# Patient Record
Sex: Male | Born: 2005 | Race: White | Hispanic: No | Marital: Single | Smoking: Never smoker
Health system: Southern US, Community
[De-identification: ages and names within clinical notes are randomized; demographics above are authoritative.]

## PROBLEM LIST (undated history)

## (undated) DIAGNOSIS — J45909 Unspecified asthma, uncomplicated: Secondary | ICD-10-CM

## (undated) DIAGNOSIS — F902 Attention-deficit hyperactivity disorder, combined type: Secondary | ICD-10-CM

## (undated) DIAGNOSIS — R278 Other lack of coordination: Secondary | ICD-10-CM

## (undated) HISTORY — DX: Other lack of coordination: R27.8

## (undated) HISTORY — DX: Attention-deficit hyperactivity disorder, combined type: F90.2

## (undated) HISTORY — PX: TYMPANOSTOMY TUBE PLACEMENT: SHX32

## (undated) HISTORY — PX: TONSILLECTOMY: SUR1361

---
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
## 2023-01-03 NOTE — Telephone Encounter (Signed)
[facility] has tried to reach out to patient.  I was able to reach patient today.  I have given him [facility] number to call by tomorrow 11/8 to get scheduled.  I have also informed [facility].  I have also provided patient with the name and number of a Karlene Einstein at [phone] for private pay counseling.

## 2023-01-03 NOTE — Telephone Encounter (Signed)
LVM to patient with [facility] information and Karlene Einstein phone number [phone]

## 2023-01-18 ENCOUNTER — Ambulatory Visit (INDEPENDENT_AMBULATORY_CARE_PROVIDER_SITE_OTHER): Payer: 59 | Admitting: Family Medicine

## 2023-01-18 ENCOUNTER — Encounter: Payer: Self-pay | Admitting: Family Medicine

## 2023-01-18 VITALS — BP 119/73 | HR 93 | Temp 97.5°F | Ht 69.0 in | Wt 271.6 lb

## 2023-01-18 DIAGNOSIS — F902 Attention-deficit hyperactivity disorder, combined type: Secondary | ICD-10-CM

## 2023-01-18 DIAGNOSIS — F419 Anxiety disorder, unspecified: Secondary | ICD-10-CM | POA: Diagnosis not present

## 2023-01-18 NOTE — Assessment & Plan Note (Signed)
Not currently on any medications.  He would like to avoid stimulants.  He has had improvement in his ADHD symptoms with starting Wellbutrin.  He feels like he is at a good dose now with the 150 mg daily.  They will follow-up next year for the physical family will reach out to [place] sooner if any issues arise.

## 2023-01-18 NOTE — Patient Instructions (Addendum)
It was very nice to see you today!  I am glad that you are feeling better!  We will continue your current medications.  Please keep your appoint with a therapist.  Will see back next year for your physical.  Please come back sooner if needed.  Return for Annual Physical.   Take care, Dr Jimmey Ralph  PLEASE NOTE:  If you had any lab tests, please let us know if you have not heard back within a few days. You may see your results on mychart before we have a chance to review them but we will give you a call once they are reviewed by [place].   If we ordered any referrals today, please let us know if you have not heard from their office within the next week.   If you had any urgent prescriptions sent in today, please check with the pharmacy within an hour of our visit to make sure the prescription was transmitted appropriately.   Please try these tips to maintain a healthy lifestyle:  Eat at least 3 REAL meals and 1-2 snacks per day.  Aim for no more than 5 hours between eating.  If you eat breakfast, please do so within one hour of getting up.   Each meal should contain half fruits/vegetables, one quarter protein, and one quarter carbs (no bigger than a computer mouse)  Cut down on sweet beverages. This includes juice, soda, and sweet tea.   Drink at least 1 glass of water with each meal and aim for at least 8 glasses per day  Exercise at least 150 minutes every week.

## 2023-01-18 NOTE — Progress Notes (Signed)
   Shateek Tillar is a 17 y.o. male who presents today for an office visit.  Assessment/Plan:  Chronic Problems Addressed Today: Anxiety Symptoms are much better controlled today on current regimen of Cymbalta 30 mg daily and Wellbutrin 150 mg daily.  He is tolerating well with minimal side effects.  We will continue his current regimen.  He will see his therapist for initial visit in a few weeks.  Anticipate he will have continued improvement over the next several weeks.  He will follow-up with me next year for her physical however did stress importance of them to reach out to [place] sooner if he notices any negative change with his mood.  ADHD (attention deficit hyperactivity disorder), combined type Not currently on any medications.  He would like to avoid stimulants.  He has had improvement in his ADHD symptoms with starting Wellbutrin.  He feels like he is at a good dose now with the 150 mg daily.  They will follow-up next year for the physical family will reach out to [place] sooner if any issues arise.    Subjective:  HPI:  See assessment / plan for status of chronic conditions. Patient here today for follow up. We last saw him 3 weeks ago for his anxiety and ADHD.  At that time we continued him on his Cymbalta as well as started him on Wellbutrin.  He has done well with this for the last several weeks.  Did have some mild insomnia but this is manageable.  His mood is much better controlled.  He has also noticed improvement in his motivation.  He has done well with staying away from electronic devices.  He recently took a second job with [employer] and this is going well.  Overall feels like he is on a good combination of medications and would like to continue with his current regimen.  He will be seeing a therapist in a few weeks.       Objective:  Physical Exam: BP 119/73   Pulse 93   Temp (!) 97.5 F (36.4 C) (Temporal)   Ht 5\' 9"  (1.753 m)   Wt 271 lb 9.6 oz (123.2 kg)   SpO2 97%   BMI 40.11  kg/m   Wt Readings from Last 3 Encounters:  01/18/23 271 lb 9.6 oz (123.2 kg) (>99%, Z= 2.72)*  12/28/22 269 lb 3.2 oz (122.1 kg) (>99%, Z= 2.69)*  08/20/22 240 lb (108.9 kg) (99%, Z= 2.26)*   * Growth percentiles are based on CDC (Boys, 2-20 Years) data.    Gen: No acute distress, resting comfortably CV: Regular rate and rhythm with no murmurs appreciated Pulm: Normal work of breathing, clear to auscultation bilaterally with no crackles, wheezes, or rhonchi Neuro: Grossly normal, moves all extremities Psych: Normal affect and thought content      Ikeisha Blumberg M. Jimmey Ralph, MD 01/18/2023 11:32 AM

## 2023-01-18 NOTE — Assessment & Plan Note (Signed)
Symptoms are much better controlled today on current regimen of Cymbalta 30 mg daily and Wellbutrin 150 mg daily.  He is tolerating well with minimal side effects.  We will continue his current regimen.  He will see his therapist for initial visit in a few weeks.  Anticipate he will have continued improvement over the next several weeks.  He will follow-up with me next year for her physical however did stress importance of them to reach out to [place] sooner if he notices any negative change with his mood.

## 2023-01-27 DIAGNOSIS — Z419 Encounter for procedure for purposes other than remedying health state, unspecified: Secondary | ICD-10-CM | POA: Diagnosis not present

## 2023-02-08 ENCOUNTER — Ambulatory Visit: Payer: 59 | Admitting: Psychology

## 2023-02-08 DIAGNOSIS — F419 Anxiety disorder, unspecified: Secondary | ICD-10-CM

## 2023-02-08 DIAGNOSIS — F902 Attention-deficit hyperactivity disorder, combined type: Secondary | ICD-10-CM

## 2023-02-08 NOTE — Progress Notes (Signed)
Comprehensive Clinical Assessment (CCA) Note  02/08/2023 Garrett Armstrong [id number]  Time Spent: 10:06  am - 10:50 am: 44 Minutes  Chief Complaint: No chief complaint on file.  Visit Diagnosis: Anxiety and ADHD.    Guardian/Payee:  self    Paperwork requested: No   Reason for Visit /Presenting Problem:  Anxiety   Mental Status Exam: Appearance:   Casual     Behavior:  Appropriate  Motor:  Normal  Speech/Language:   Clear and Coherent  Affect:  Appropriate  Mood:  normal  Thought process:  normal  Thought content:    WNL  Sensory/Perceptual disturbances:    WNL  Orientation:  oriented to person, place, time/date, and situation  Attention:  Good  Concentration:  Good  Memory:  WNL  Fund of knowledge:   Good  Insight:    Good  Judgment:   Good  Impulse Control:  Good   Reported Symptoms:  anxiety, depression, and ADHD  Risk Assessment: Danger to Self:  No Self-injurious Behavior: No Danger to Others: No Duty to Warn:no Physical Aggression / Violence:No  Access to Firearms a concern: No  Gang Involvement:No  Patient / guardian was educated about steps to take if suicide or homicide risk level increases between visits: no While future psychiatric events cannot be accurately predicted, the patient does not currently require acute inpatient psychiatric care and does not currently meet [facility] involuntary commitment criteria.   Family history of cousins attempting suicide, "quite a few".   In case of a mental health emergency:  58 - confidential suicide hotline. [facility]        [address], [place] [address]       [phone] 3.   911  4.   Visiting Nearest ED.    Substance Abuse History: Current substance abuse: No     Caffeine: ~3x-4x a week.  Tobacco: NA Alcohol: denied.  Substance use: Denied.   Past Psychiatric History:   Previous psychological history is significant for ADHD and  anxiety Outpatient Providers: Previously seeing Dr. Christy Gentles History of Psych Hospitalization: No  Psychological Testing:  Dr. Reita Cliche Crumb.     [place] is unaware of any familial mental health concerns.    Abuse History:  Victim of: No.,  na    Report needed: No. Victim of Neglect:No. Perpetrator of  na   Witness / Exposure to Domestic Violence: No   Protective Services Involvement: No  Witness to [employer] Violence:  No   Family History:  Family History  Adopted: Yes  Problem Relation Age of Onset   Diabetes Mother    Drug abuse Mother    Drug abuse Father    Asthma Father    Diabetes Maternal Aunt    Diabetes Maternal Uncle    Diabetes Other     Living situation: the patient lives with their family  Sexual Orientation: Straight  Relationship Status: single  Name of spouse / other: na If a parent, number of children / ages:na  Support Systems: parents  Financial Stress:   NA  Income/Employment/Disability: Employment: part-time jobs at [employer] and [employer] .  Military Service: No   Educational History: Education: Government social research officer, taking a semester off and noted failing a semester. 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   Stressors: Other: worry about  being a failure, mood and symptoms.     Strengths: Family, Friends, Hopefulness, Journalist, newspaper, and Able to Communicate Effectively  Barriers:  Mood   Legal History: Pending legal issue / charges: The patient has no significant history of legal issues. History of legal issue / charges:  None  Medical History/Surgical History: reviewed Past Medical History:  Diagnosis Date   ADHD (attention deficit hyperactivity disorder), combined type 06/02/2015   Asthma    Dysgraphia 06/02/2015    Past Surgical History:   Procedure Laterality Date   TONSILLECTOMY     TYMPANOSTOMY TUBE PLACEMENT      Medications: Current Outpatient Medications  Medication Sig Dispense Refill   buPROPion (WELLBUTRIN XL) 150 MG 24 hr tablet Take 1 tablet (150 mg total) by mouth daily. 90 tablet 3   DENTA 5000 PLUS 1.1 % CREA dental cream Take by mouth.     DULoxetine (CYMBALTA) 30 MG capsule Take 1 capsule (30 mg total) by mouth daily. 90 capsule 3   Multiple Vitamin (MULTIVITAMIN) capsule Take 1 capsule by mouth daily.     No current facility-administered medications for this visit.    No Known Allergies  Diagnoses:  ADHD (attention deficit hyperactivity disorder), combined type  Anxiety  Psychiatric Treatment: Yes , via PCP.  Plan of Care: Outpatient therapy  Narrative:   Joangel Smathers participated from home, via video, is aware of tele-sessions limitations, and consented to treatment. Therapist participated from home office. We reviewed the limits of confidentiality prior to the start of the evaluation. Yaqub Reine expressed understanding and agreement to proceed. He was referred for anxiety and has a history of ADHD, as well. He endorsed anxiety attacks from elementary to highschool, during lunch. He noted choking on food and his asthma. He noted worry about health and labeled this "like a hypochondriac". He noted that he "grew out of it" in relation to as asthma but noted that this is a "left over fear". He noted his symptoms are largely better with his current medication regimen but noted a need to develop tools to proactively manage this. He noted having anxiety attacks driving to school, currently, but noted this being more manageable. In addition, he noted having issues motivating self to "do things" specifically school work but also noted difficulty with non-school related tasks. He noted procrastination being an issue and noted "things that are more optional, I won't do them". He takes his medication  consistently and without concern. He noted some improvement in attention but noted continued difficulty with engaging in school related tasks. He was previously prescribed Vyvanse but noted increased heart rate and anxiety during that time. He was not taking any anti-depressant or anxiolytic at the time. He denied any checking behavior and noted his primary concerns being anxiety. His panic symptoms include numbness, anxiety, drying throat, changes in breathing, body tingling, increased heart-rate, feeling crazy, feeling like he will die.  Guenther would benefit from counseling to address his symptoms, both anxiety and ADHD, bolster coping skills, develop organizational skills, and verbalize thoughts and feelings.  In addition to therapy he would benefit from medication check and possible switch of his ADHD medication.  Therapist will facilitate a psychiatric referral upon request.  Brylee is currently receiving medication management via his PCP. A follow-up was scheduled to create a treatment plan and begin treatment. Therapist answered all questions during the evaluation and contact information was provided.   GAD7: 3 PHQ9: 2  Delight Ovens, LCSW

## 2023-02-27 DIAGNOSIS — Z419 Encounter for procedure for purposes other than remedying health state, unspecified: Secondary | ICD-10-CM | POA: Diagnosis not present

## 2023-03-11 ENCOUNTER — Ambulatory Visit (HOSPITAL_BASED_OUTPATIENT_CLINIC_OR_DEPARTMENT_OTHER): Payer: Medicaid Other | Admitting: Orthopaedic Surgery

## 2023-03-18 ENCOUNTER — Encounter: Payer: Self-pay | Admitting: Psychology

## 2023-03-18 ENCOUNTER — Ambulatory Visit: Payer: 59 | Admitting: Psychology

## 2023-03-18 DIAGNOSIS — F902 Attention-deficit hyperactivity disorder, combined type: Secondary | ICD-10-CM

## 2023-03-18 DIAGNOSIS — F419 Anxiety disorder, unspecified: Secondary | ICD-10-CM

## 2023-03-18 NOTE — Progress Notes (Signed)
[facility] Counselor/Therapist Progress Note  Patient ID: Garrett Armstrong, MRN: [id number]   Date: 03/18/23  Time Spent: 8:02  am - 8:49 am : 47 Minutes  Treatment Type: Individual Therapy.  Reported Symptoms: ADHD, anxiety, and depression.   Mental Status Exam: Appearance:  Casual     Behavior: Appropriate  Motor: Normal  Speech/Language:  Clear and Coherent  Affect: Congruent  Mood: dysthymic  Thought process: normal  Thought content:   WNL  Sensory/Perceptual disturbances:   WNL  Orientation: oriented to person, place, time/date, and situation  Attention: Good  Concentration: Good  Memory: WNL  Fund of knowledge:  Good  Insight:   Good  Judgment:  Good  Impulse Control: Good   Risk Assessment: Danger to Self:  No Self-injurious Behavior: No Danger to Others: No Duty to Warn:no Physical Aggression / Violence:No  Access to Firearms a concern: No  Gang Involvement:No   Subjective:   Garrett Armstrong participated from home, via video and consented to treatment. Therapist participated from office. I discussed the limitations of evaluation and management by telemedicine and the availability of in person appointments. The patient expressed understanding and agreed to proceed. Harvir reviewed the events of the past week. We reviewed numerous treatment approaches including CBT, BA, Problem Solving, and Solution focused therapy. Psych-education regarding the Garrett Armstrong's diagnosis of ADHD (attention deficit hyperactivity disorder), combined type  Anxiety was provided during the session. We discussed Burns Skluzacek goals treatment goals which include increase motivation, setting goals, being more involved starting and completing tasks, increase social interaction, identify and engage in a new hobby, exercising regularly. Additional goals include increasing mindfulness, symptom management, engagement in consistent self-care, developing organizational skills. Garrett Armstrong provided verbal approval of the treatment plan. We completed the ASRS V1.1 during the session. Please see below for details.   Interventions: Psycho-education & Goal Setting.   Diagnosis:  ADHD (attention deficit hyperactivity disorder), combined type  Anxiety  Psychiatric Treatment: Yes , Via PCP.    Treatment Plan:  Client Abilities/Strengths Cozy is intelligent, self-aware, and motivated for change.   Support System: Family and friends.   Client Treatment Preferences OPT.   Client Statement of Needs [name] would like to increase motivation, setting goals, being more involved starting and completing tasks, increase social interaction, identify and engage in a new hobby, exercising regularly. Additional goals include increasing mindfulness, symptom management, engagement in consistent self-care, developing organizational skills  Treatment Level Weekly  Symptoms  Anxiety: Anxious and irritability, health related anxiety, panic (driving), changes in breathing, increased heart-rate, tingling.     (Status: maintained) Depression: Loss of interest, low motivation. (Status: maintained) ADHD: Difficulty getting organized, difficulty remembering appointments, avoidence of tasks that require a lot of thought, fidgeting, driven by motor, careless mistakes, difficulty sustaining attention, distracted by external stimuli, talking too much in social situations, finishing others' sentences, difficulty waiting turn, difficulty initiating, maintaining, and completing tasks. (Status: maintained)  ASRS V1.1  Part-A 1. How often do you have trouble wrapping up the final details of a project,     once the challenging parts have been done? Never 2. How often do you have difficulty getting things in order when you have to do     a task that requires organization? Sometimes 3. How often do you have problems remembering appointments or obligations? Sometimes 4. When you have a task  that requires a lot of thought, how often do you avoid     or delay getting started? Often 5. How often  do you fidget or squirm with your hands or feet when you have     to sit down for a long time? Often 6. How often do you feel overly active and compelled to do things, like you     were driven by a motor? Sometimes  Part-B 7. How often do you make careless mistakes when you have to work on a boring or     difficult project? Sometimes 8. How often do you have difficulty keeping your attention when you are doing boring     or repetitive work? Sometimes 9. How often do you have difficulty concentrating on what people say to you,      even when they are speaking to you directly? Sometimes 10. How often do you misplace or have difficulty finding things at home or at work? Rarely 11. How often are you distracted by activity or noise around you? Sometimes 12. How often do you leave your seat in meetings or other situations in which       you are expected to remain seated? Never 13. How often do you feel restless or fidgety? Sometimes 14. How often do you have difficulty unwinding and relaxing when you have time       to yourself? Never 15. How often do you find yourself talking too much when you are in social situations? Sometimes 16. When you're in a conversation, how often do you find yourself finishing           the sentences of the people you are talking to, before they can finish       them themselves? Often 17. How often do you have difficulty waiting your turn in situations when       turn taking is required? Sometimes 18. How often do you interrupt others when they are busy? Rarely    Goals:   Tou experiences symptoms of ADHD, anxiety, and depression.   Treatment plan signed and available on s-drive:  No, pending signature.    Target Date: 03/17/24 Frequency: Weekly  Progress: 0 Modality: individual    Therapist will provide referrals for additional resources as  appropriate.  Therapist will provide psycho-education regarding [name]'s diagnosis and corresponding treatment approaches and interventions. Delight Ovens, LCSW will support the patient's ability to achieve the goals identified. will employ CBT, BA, Problem-solving, Solution Focused, Mindfulness,  coping skills, & other evidenced-based practices will be used to promote progress towards healthy functioning to help manage decrease symptoms associated with his diagnosis.   Reduce overall level, frequency, and intensity of the feelings of depression, anxiety, ADHD, and panic evidenced by decreased overall symptoms from 6 to 7 days/week to 0 to 1 days/week per client report for at least 3 consecutive months. Verbally express understanding of the relationship between feelings of depression, anxiety and their impact on thinking patterns and behaviors. Verbalize an understanding of the role that distorted thinking plays in creating fears, excessive worry, and ruminations.    [name] participated in the creation of the treatment plan)   Delight Ovens, LCSW

## 2023-03-30 DIAGNOSIS — Z419 Encounter for procedure for purposes other than remedying health state, unspecified: Secondary | ICD-10-CM | POA: Diagnosis not present

## 2023-04-01 ENCOUNTER — Ambulatory Visit (INDEPENDENT_AMBULATORY_CARE_PROVIDER_SITE_OTHER): Payer: 59 | Admitting: Psychology

## 2023-04-01 DIAGNOSIS — F902 Attention-deficit hyperactivity disorder, combined type: Secondary | ICD-10-CM | POA: Diagnosis not present

## 2023-04-01 DIAGNOSIS — F419 Anxiety disorder, unspecified: Secondary | ICD-10-CM

## 2023-04-01 NOTE — Progress Notes (Signed)
[facility] Counselor/Therapist Progress Note  Patient ID: Adyen Bifulco, MRN: [id number]   Date: 04/01/23  Time Spent: 9:03  am - 9:47 am : 44 Minutes  Treatment Type: Individual Therapy.  Reported Symptoms: ADHD, anxiety, and depression.   Mental Status Exam: Appearance:  Casual     Behavior: Appropriate  Motor: Normal  Speech/Language:  Clear and Coherent  Affect: Congruent  Mood: dysthymic  Thought process: normal  Thought content:   WNL  Sensory/Perceptual disturbances:   WNL  Orientation: oriented to person, place, time/date, and situation  Attention: Good  Concentration: Good  Memory: WNL  Fund of knowledge:  Good  Insight:   Good  Judgment:  Good  Impulse Control: Good   Risk Assessment: Danger to Self:  No Self-injurious Behavior: No Danger to Others: No Duty to Warn:no Physical Aggression / Violence:No  Access to Firearms a concern: No  Gang Involvement:No   Subjective:   [name] participated from home, via video and consented to treatment. Therapist participated from office. Damareon reviewed the events of the past week. 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 He noted being "persuaded" to work towards applying to jobs. He noted on working on being more productive and engaging in small tasks around the home including doing dishes, walking the dog, and being more proactive with household tasks. He noted "not having the discipline for keeping myself accountable for the tasks". He noted "that if tasks seem complicated, I just won't start it". We worked on processing a pending task that he is reticent to begin. He noted needing to contact the school to get a drug test & get information regarding CDL license. He noted barriers being "the fear of difficulty", being unaware of the process, recalling past failures, negative perspective, eschewing more enjoyable  activities, and fear of not being able  to complete tasks". We worked on identifying ways to manage these barriers going forward. Iann noted developing a "reward" based system" but noted that he already does all the tasks he enjoys without having to complete other tasks as "prerequisite". We worked on processing this during the session. Therapist discussed the importance of reducing stimulation day-to-day and the effects on attention and task engagement. Therapist encouraged Cline to work on reducing his stimulation and provided a handout, "dopamine detox" to provide various ways to reduce stimulation and increase engagement in alternative activities such as exercise and mindfulness. Arnet was engaged and motivated and expressed commitment towards goals. Therapist praised [place] and provided supportive therapy. A follow-up was scheduled for continued treatment, which Jaking benefits from.   Interventions: Psycho-education & CBT  Diagnosis:  ADHD (attention deficit hyperactivity disorder), combined type  Anxiety  Psychiatric Treatment: Yes , Via PCP.    Treatment Plan:  Client Abilities/Strengths Calden is intelligent, self-aware, and motivated for change.   Support System: Family and friends.   Client Treatment Preferences OPT.   Client Statement of Needs Ewald would like to increase motivation, setting goals, being more involved starting and completing tasks, increase social interaction, identify and engage in a new hobby, exercising regularly. Additional goals include increasing mindfulness, symptom management, engagement in consistent self-care, developing organizational skills  Treatment Level Weekly  Symptoms  Anxiety: Anxious and irritability, health related anxiety, panic (driving), changes in breathing, increased heart-rate, tingling.     (Status: maintained) Depression: Loss of interest, low motivation. (Status: maintained) ADHD: Difficulty getting organized, difficulty  remembering appointments, avoidence of tasks that require a lot of thought, fidgeting, driven by motor, careless mistakes, difficulty sustaining attention, distracted by external stimuli, talking too much in social situations, finishing others' sentences, difficulty waiting turn, difficulty initiating, maintaining, and completing tasks. (Status: maintained)   Goals:   Kevron experiences symptoms of ADHD, anxiety, and depression.   Treatment plan signed and available on s-drive:  No, pending signature.    Target Date: 03/17/24 Frequency: Weekly  Progress: 0 Modality: individual    Therapist will provide referrals for additional resources as appropriate.  Therapist will provide psycho-education regarding Kyro's diagnosis and corresponding treatment approaches and interventions. Delight Ovens, LCSW will support the patient's ability to achieve the goals identified. will employ CBT, BA, Problem-solving, Solution Focused, Mindfulness,  coping skills, & other evidenced-based practices will be used to promote progress towards healthy functioning to help manage decrease symptoms associated with his diagnosis.   Reduce overall level, frequency, and intensity of the feelings of depression, anxiety, ADHD, and panic evidenced by decreased overall symptoms from 6 to 7 days/week to 0 to 1 days/week per client report for at least 3 consecutive months. Verbally express understanding of the relationship between feelings of depression, anxiety and their impact on thinking patterns and behaviors. Verbalize an understanding of the role that distorted thinking plays in creating fears, excessive worry, and ruminations.    [name] participated in the creation of the treatment plan)   Delight Ovens, LCSW

## 2023-04-15 ENCOUNTER — Ambulatory Visit: Payer: 59 | Admitting: Psychology

## 2023-04-15 DIAGNOSIS — F419 Anxiety disorder, unspecified: Secondary | ICD-10-CM | POA: Diagnosis not present

## 2023-04-15 DIAGNOSIS — F902 Attention-deficit hyperactivity disorder, combined type: Secondary | ICD-10-CM | POA: Diagnosis not present

## 2023-04-15 NOTE — Progress Notes (Signed)
[facility] Counselor/Therapist Progress Note  Patient ID: Garrett Armstrong, MRN: [id number]   Date: 04/15/23  Time Spent: 9:06  am - 9:58 am : 52 Minutes  Treatment Type: Individual Therapy.  Reported Symptoms: ADHD, anxiety, and depression.   Mental Status Exam: Appearance:  Casual     Behavior: Appropriate  Motor: Normal  Speech/Language:  Clear and Coherent  Affect: Congruent  Mood: dysthymic  Thought process: normal  Thought content:   WNL  Sensory/Perceptual disturbances:   WNL  Orientation: oriented to person, place, time/date, and situation  Attention: Good  Concentration: Good  Memory: WNL  Fund of knowledge:  Good  Insight:   Good  Judgment:  Good  Impulse Control: Good   Risk Assessment: Danger to Self:  No Self-injurious Behavior: No Danger to Others: No Duty to Warn:no Physical Aggression / Violence:No  Access to Firearms a concern: No  Gang Involvement:No   Subjective:   Garrett Armstrong participated from home, via video and consented to treatment. Therapist participated from office. Garrett Armstrong reviewed the events of the past week. 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 He noted keeping up with household chores. He noted quitting his part-time job at [employer] after being told he could not wear headphones while he worked. He noted the headphones working to distract from rumination. He noted distraction being his primary tool to manage this. He noted "changing the topic" comes right back to it. He noted often dissecting an interaction and working on identifying ways he could have changes his behavior, affected the outcome, and shifted the other person's experience. Therapist provided psycho-education regarding rumination and the cost of this on mood and increasing anxiety. We worked on delineating ways to manage this going forward. Therapist encouraged Garrett Armstrong to identify a areas of control and lack of control,  rescripting a scenario, identifying hoe to engage that aligns with values, and managing frustration. Therapist introduced "Postponing worry" and provided handout to be reviewed prior to our follow-up. Garrett Armstrong was engaged and motivated during the session. He expressed commitment towards our goals. A follow-up was scheduled for continued treatment.   Interventions: Psycho-education & CBT  Diagnosis:  ADHD (attention deficit hyperactivity disorder), combined type  Anxiety  Psychiatric Treatment: Yes , Via PCP.    Treatment Plan:  Client Abilities/Strengths Garrett Armstrong is intelligent, self-aware, and motivated for change.   Support System: Family and friends.   Client Treatment Preferences OPT.   Client Statement of Needs Garrett Armstrong would like to increase motivation, setting goals, being more involved starting and completing tasks, increase social interaction, identify and engage in a new hobby, exercising regularly. Additional goals include increasing mindfulness, symptom management, engagement in consistent self-care, developing organizational skills  Treatment Level Weekly  Symptoms  Anxiety: Anxious and irritability, health related anxiety, panic (driving), changes in breathing, increased heart-rate, tingling.     (Status: maintained) Depression: Loss of interest, low motivation. (Status: maintained) ADHD: Difficulty getting organized, difficulty remembering appointments, avoidence of tasks that require a lot of thought, fidgeting, driven by motor, careless mistakes, difficulty sustaining attention, distracted by external stimuli, talking too much in social situations, finishing others' sentences, difficulty waiting turn, difficulty initiating, maintaining, and completing tasks. (Status: maintained)   Goals:   Garrett Armstrong experiences symptoms of ADHD, anxiety, and depression.   Treatment plan signed and available on s-drive:  No, pending signature.    Target Date: 03/17/24 Frequency:  Weekly  Progress: 0 Modality: individual    Therapist  will provide referrals for additional resources as appropriate.  Therapist will provide psycho-education regarding Garrett Armstrong's diagnosis and corresponding treatment approaches and interventions. Delight Ovens, LCSW will support the patient's ability to achieve the goals identified. will employ CBT, BA, Problem-solving, Solution Focused, Mindfulness,  coping skills, & other evidenced-based practices will be used to promote progress towards healthy functioning to help manage decrease symptoms associated with his diagnosis.   Reduce overall level, frequency, and intensity of the feelings of depression, anxiety, ADHD, and panic evidenced by decreased overall symptoms from 6 to 7 days/week to 0 to 1 days/week per client report for at least 3 consecutive months. Verbally express understanding of the relationship between feelings of depression, anxiety and their impact on thinking patterns and behaviors. Verbalize an understanding of the role that distorted thinking plays in creating fears, excessive worry, and ruminations.    Garrett Armstrong participated in the creation of the treatment plan)   Delight Ovens, LCSW

## 2023-04-27 DIAGNOSIS — Z419 Encounter for procedure for purposes other than remedying health state, unspecified: Secondary | ICD-10-CM | POA: Diagnosis not present

## 2023-04-29 ENCOUNTER — Ambulatory Visit: Payer: 59 | Admitting: Psychology

## 2023-04-29 DIAGNOSIS — F419 Anxiety disorder, unspecified: Secondary | ICD-10-CM | POA: Diagnosis not present

## 2023-04-29 DIAGNOSIS — F902 Attention-deficit hyperactivity disorder, combined type: Secondary | ICD-10-CM

## 2023-04-29 NOTE — Progress Notes (Signed)
 [facility] Counselor/Therapist Progress Note  Patient ID: Garrett Armstrong, MRN: [id number]   Date: 04/29/23  Time Spent: 9:03  am - 9:59  am : 56 Minutes  Treatment Type: Individual Therapy.  Reported Symptoms: ADHD, anxiety, and depression.   Mental Status Exam: Appearance:  Casual     Behavior: Appropriate  Motor: Normal  Speech/Language:  Clear and Coherent  Affect: Congruent  Mood: dysthymic  Thought process: normal  Thought content:   WNL  Sensory/Perceptual disturbances:   WNL  Orientation: oriented to person, place, time/date, and situation  Attention: Good  Concentration: Good  Memory: WNL  Fund of knowledge:  Good  Insight:   Good  Judgment:  Good  Impulse Control: Good   Risk Assessment: Danger to Self:  No Self-injurious Behavior: No Danger to Others: No Duty to Warn:no Physical Aggression / Violence:No  Access to Firearms a concern: No  Gang Involvement:No   Subjective:   Garrett Armstrong participated from home, via video and consented to treatment. Therapist participated from office. Garrett Armstrong reviewed the events of the past week. 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 He noted feeling financially stressed and having to ask his mother for money. He noted the length of the course, in weeks, is stressful. We worked on exploring this during the session. He noted experiencing distress when consuming media when there is not "justice". He noted this "ruining" his day and that he gets "very upset". We worked on exploring this during the session. He endorsed distress with social interactions when people do not consider his perspective. Therapist highlighted rigidity and jumps to conclusions regarding others, their actions, and what said actions say about them as a whole. Therapist highlighted cognitive dissonance during the session. We worked on exploring this during the session  and discussed the etiology of this. We worked on identifying the pros and cons of this approach and will continue exploring this going forward. Therapist highlighted possible distoritions during the session. Garrett Armstrong was engaged and motivated during the session. He expressed commitment towards goals. Therapist praised Garrett Armstrong for his effort and provided supportive therapy. A follow-up was scheduled for continued treatment, which Garrett Armstrong benefits from.    Interventions:  CBT  Diagnosis:  ADHD (attention deficit hyperactivity disorder), combined type  Anxiety  Psychiatric Treatment: Yes , Via PCP.    Treatment Plan:  Client Abilities/Strengths Garrett Armstrong is intelligent, self-aware, and motivated for change.   Support System: Family and friends.   Client Treatment Preferences OPT.   Client Statement of Needs Garrett Armstrong would like to increase motivation, setting goals, being more involved starting and completing tasks, increase social interaction, identify and engage in a new hobby, exercising regularly. Additional goals include increasing mindfulness, symptom management, engagement in consistent self-care, developing organizational skills  Treatment Level Weekly  Symptoms  Anxiety: Anxious and irritability, health related anxiety, panic (driving), changes in breathing, increased heart-rate, tingling.     (Status: maintained)  Depression: Loss of interest, low motivation. (Status: maintained)  ADHD: Difficulty getting organized, difficulty remembering appointments, avoidence of tasks that require a lot of thought, fidgeting, driven by motor, careless mistakes, difficulty sustaining attention, distracted by external stimuli, talking too much in social situations, finishing others' sentences, difficulty waiting turn, difficulty initiating, maintaining, and completing tasks. (Status: maintained)   Goals:   Garrett Armstrong experiences symptoms of ADHD, anxiety, and depression.   Treatment plan signed  and available on s-drive:  No,  pending signature.    Target Date: 03/17/24 Frequency: Weekly  Progress: 0 Modality: individual    Therapist will provide referrals for additional resources as appropriate.  Therapist will provide psycho-education regarding Garrett Armstrong's diagnosis and corresponding treatment approaches and interventions. Delight Ovens, LCSW will support the patient's ability to achieve the goals identified. will employ CBT, BA, Problem-solving, Solution Focused, Mindfulness,  coping skills, & other evidenced-based practices will be used to promote progress towards healthy functioning to help manage decrease symptoms associated with his diagnosis.   Reduce overall level, frequency, and intensity of the feelings of depression, anxiety, ADHD, and panic evidenced by decreased overall symptoms from 6 to 7 days/week to 0 to 1 days/week per client report for at least 3 consecutive months. Verbally express understanding of the relationship between feelings of depression, anxiety and their impact on thinking patterns and behaviors. Verbalize an understanding of the role that distorted thinking plays in creating fears, excessive worry, and ruminations.    Garrett Armstrong participated in the creation of the treatment plan)   Delight Ovens, LCSW

## 2023-05-14 ENCOUNTER — Ambulatory Visit: Payer: 59 | Admitting: Psychology

## 2023-05-28 ENCOUNTER — Ambulatory Visit: Admitting: Psychology

## 2023-06-08 DIAGNOSIS — Z419 Encounter for procedure for purposes other than remedying health state, unspecified: Secondary | ICD-10-CM | POA: Diagnosis not present

## 2023-07-08 DIAGNOSIS — Z419 Encounter for procedure for purposes other than remedying health state, unspecified: Secondary | ICD-10-CM | POA: Diagnosis not present

## 2023-08-08 DIAGNOSIS — Z419 Encounter for procedure for purposes other than remedying health state, unspecified: Secondary | ICD-10-CM | POA: Diagnosis not present

## 2023-08-21 ENCOUNTER — Encounter: Payer: Medicaid Other | Admitting: Family Medicine

## 2023-09-07 DIAGNOSIS — Z419 Encounter for procedure for purposes other than remedying health state, unspecified: Secondary | ICD-10-CM | POA: Diagnosis not present

## 2023-10-08 ENCOUNTER — Encounter: Admitting: Family Medicine

## 2023-10-08 DIAGNOSIS — Z419 Encounter for procedure for purposes other than remedying health state, unspecified: Secondary | ICD-10-CM | POA: Diagnosis not present

## 2023-11-04 ENCOUNTER — Ambulatory Visit: Payer: Self-pay

## 2023-11-04 NOTE — Telephone Encounter (Signed)
 FYI Only or Action Required?: Action required by provider: appt 11/07/23, pt currently at work, advised UC/ED if symptoms worsen.  Patient was last seen in primary care on 01/18/2023 by Kennyth Worth HERO, MD.  Called Nurse Triage reporting Hypertension.  Symptoms began several days ago.  Interventions attempted: Nothing.  Symptoms are: unchanged.  Triage Disposition: See PCP Within 2 Weeks  Patient/caregiver understands and will follow disposition?: Yes    Copied from CRM [id number]. Topic: Clinical - Red Word Triage >> Nov 04, 2023 12:42 PM Robinson H wrote: [facility] that prompted transfer to Nurse Triage: Weight loss clinic yesterday and pressure was 154/102 at least 2 weeks , glucose high and cholesterol high, started Zepbound Saturday. Reason for Disposition  [1] Systolic BP >= 130 OR Diastolic >= 80 AND [2] not taking BP medications  Answer Assessment - Initial Assessment Questions Pt's mother reports bp reading from clinic yesterday. Pt's mother reports pt currently at work, requested really late or early appt; scheduled 11/07/23 at 0725. Advised UC/ED if symptoms worsen.   1. BLOOD PRESSURE: What is your blood pressure? Did you take at least two measurements 5 minutes apart?     No, patient currently at work. Last bp yesterday at weight loss clinic; 154/102 2. ONSET: When did you take your blood pressure?      2 weeks ago 3. HOW: How did you take your blood pressure? (e.g., automatic home BP monitor, visiting nurse)     Wt loss clinic yesterday 4. HISTORY: Do you have a history of high blood pressure?     No; showed high cholesterol and high glucose from lab work at weight loss clinic ;no hx 5. MEDICINES: Are you taking any medicines for blood pressure? Have you missed any doses recently?     no 6. OTHER SYMPTOMS: Do you have any symptoms? (e.g., blurred vision, chest pain, difficulty breathing, headache, weakness)   Pt's mother denies pt c/o blurred vision,  HA, weakness, chest pain. Numbness  Protocols used: Blood Pressure - High-A-AH

## 2023-11-04 NOTE — Telephone Encounter (Signed)
 See note, patient has an OV on 11/07/2023

## 2023-11-07 ENCOUNTER — Ambulatory Visit: Admitting: Family Medicine

## 2023-11-08 DIAGNOSIS — Z419 Encounter for procedure for purposes other than remedying health state, unspecified: Secondary | ICD-10-CM | POA: Diagnosis not present

## 2023-11-21 ENCOUNTER — Other Ambulatory Visit (HOSPITAL_COMMUNITY): Payer: Self-pay

## 2023-11-21 ENCOUNTER — Ambulatory Visit (INDEPENDENT_AMBULATORY_CARE_PROVIDER_SITE_OTHER): Admitting: Family Medicine

## 2023-11-21 ENCOUNTER — Other Ambulatory Visit: Payer: Self-pay | Admitting: Family Medicine

## 2023-11-21 ENCOUNTER — Encounter: Payer: Self-pay | Admitting: Family Medicine

## 2023-11-21 ENCOUNTER — Telehealth: Payer: Self-pay

## 2023-11-21 DIAGNOSIS — R030 Elevated blood-pressure reading, without diagnosis of hypertension: Secondary | ICD-10-CM

## 2023-11-21 DIAGNOSIS — L259 Unspecified contact dermatitis, unspecified cause: Secondary | ICD-10-CM

## 2023-11-21 DIAGNOSIS — E785 Hyperlipidemia, unspecified: Secondary | ICD-10-CM | POA: Diagnosis not present

## 2023-11-21 MED ORDER — BETAMETHASONE DIPROPIONATE 0.05 % EX OINT: 1.0000 | TOPICAL_OINTMENT | Freq: Two times a day (BID) | CUTANEOUS | 5 refills | Status: DC

## 2023-11-21 MED ORDER — TIRZEPATIDE-WEIGHT MANAGEMENT 5 MG/0.5ML ~~LOC~~ SOAJ: 5.0000 mg | SUBCUTANEOUS | 5 refills | Status: AC

## 2023-11-21 NOTE — Assessment & Plan Note (Signed)
 Uses betamethasone  as needed.  Will refill today.

## 2023-11-21 NOTE — Assessment & Plan Note (Signed)
 He is currently seeing weight loss clinic and prescribed tirzepatide  2.5 mg weekly.  He has been on this for the last few weeks.  Apparently he had some issues with insurance coverage and his weight management physician told him that he needed to come here for ongoing prescription.  We will increase to 5 mg weeklyHe is having some mild nausea but this is manageable.  We did discuss dietary changes as well.  He will follow-up with us  in a few weeks via MyChart and we can adjust the dose as tolerated.  He will come back for in person office visit in 3 to 6 months.

## 2023-11-21 NOTE — Telephone Encounter (Signed)
 Pharmacy Patient Advocate Encounter   Received notification from Onbase that prior authorization for Zepbound  5MG /0.5ML pen-injectors is required/requested.   Insurance verification completed.   The patient is insured through [facility] MEDICAID .   Per test claim: Per test claim, medication is not covered due to plan/benefit exclusion, PA not submitted at this time MUST USE ORLISTAT, QSYMIA, SAXENDA, WEGOVY. MED NECESSITY EXCEPTION ONLY. AND WILL STILL NEED A PRIOR AUTHORIZATION  Pharmacy Patient Advocate Encounter PLEASE BE ADVISED Effective October 1st, Medicaid will discontinue coverage of GLP1 medications for weight loss (such as Wegovy and Zepbound ), unless the patient has a documented history of a heart attack or stroke. Zepbound  will continue to be covered only for patients with moderate to severe sleep apnea (AHI 15-30). Because of this change, the prior authorization team will not be submitting new PA requests for GLP1 medications prescribed for weight loss between now and October 1st, as patients will be unable to continue therapy under Medicaid coverage.

## 2023-11-21 NOTE — Progress Notes (Signed)
   Garrett Armstrong is a 18 y.o. male who presents today for an office visit.  Assessment/Plan:  Chronic Problems Addressed Today: Morbid obesity (HCC) He is currently seeing weight loss clinic and prescribed tirzepatide  2.5 mg weekly.  He has been on this for the last few weeks.  Apparently he had some issues with insurance coverage and his weight management physician told him that he needed to come here for ongoing prescription.  We will increase to 5 mg weeklyHe is having some mild nausea but this is manageable.  We did discuss dietary changes as well.  He will follow-up with us  in a few weeks via MyChart and we can adjust the dose as tolerated.  He will come back for in person office visit in 3 to 6 months.  Dyslipidemia Apparently had mildly elevated LDL on recent labs found at the weight loss clinic.  He will has a copy of his records and send them via MyChart.  Elevated blood pressure reading At goal today.  We natural course of blood pressure readings and that the temporary elevations are not harmful.  Given that his blood pressure is well-controlled today.  We will continue with watchful waiting for the next 2 weeks.  He will monitor at home and follow-up with us  in a few weeks.  We discussed proper technique for measuring blood pressure.  Contact dermatitis Uses betamethasone  as needed.  Will refill today.     Subjective:  HPI:  See assessment / plan for status of chronic conditions.   Discussed the use of AI scribe software for clinical note transcription with the patient, who gave verbal consent to proceed.  History of Present Illness Garrett Armstrong is a 18 year old male with hypertension and obesity who presents for follow-up on blood pressure management and weight loss.  He has been attending a weight loss clinic and was started on tirzepatide . His blood pressure initially measured 150/101 mmHg, decreased to 144/86 mmHg, and is now 130/72 mmHg. No symptoms such as shortness  of breath, chest pain, headache, or blurred vision. He has n been monitoring blood pressure at home.ot   He faces challenges with insurance coverage for tirzepatide , which was denied by Medicaid. He has been on a 2.5 mg dose for four weeks and is due to increase to 5 mg. He is losing 1-2 pounds per week consistently and experiences nausea, particularly in the mornings, which he attributes to dietary changes.  He has a history of high cholesterol, with initial levels over 200 mg/dL. He does not recall specific numbers but believes his LDL was not excessively high.  He has a nickel allergy, which causes skin reactions. He reports a recent flare-up after contact with a phone charger and uses betamethasone  cream to manage symptoms, requiring a refill.         Objective:  Physical Exam: BP 130/72 (BP Location: Left Arm, Patient Position: Sitting, Cuff Size: Large)   Pulse 91   Temp 98.4 F (36.9 C) (Temporal)   Ht 5' 9 (1.753 m)   Wt 285 lb (129.3 kg)   SpO2 99%   BMI 42.09 kg/m   Gen: No acute distress, resting comfortably Neuro: Grossly normal, moves all extremities Psych: Normal affect and thought content      Garrett Hight M. Kennyth, MD 11/21/2023 7:58 AM

## 2023-11-21 NOTE — Patient Instructions (Addendum)
 It was very nice to see you today!  VISIT SUMMARY: During your visit, we discussed your progress with weight loss and blood pressure management. We also addressed your high cholesterol and nickel allergy.  YOUR PLAN: OBESITY AND WEIGHT MANAGEMENT: You are currently on tirzepatide  to help with weight loss, but your insurance denied coverage for the increased dose. -Continue taking tirzepatide  2.5 mg and increase to 5 mg as planned. -We will send a prescription for tirzepatide  5 mg to the pharmacy and submit a prior authorization to your insurance company. -Focus on sustainable dietary changes, such as reducing carbohydrates and increasing protein intake. -Continue attending the weight loss clinic weekly. -Monitor your weight loss progress with a goal of 1-2 pounds per week.  ELEVATED BLOOD PRESSURE: Your blood pressure has improved but still needs monitoring. -Monitor your blood pressure at home daily for the next few weeks, ensuring proper technique. -Send a MyChart message in a few weeks with your home blood pressure readings. -We will discuss starting blood pressure medication if your home readings remain elevated.  HYPERCHOLESTEROLEMIA: Your cholesterol levels were high before starting the weight loss program. -Request your cholesterol lab results via MyChart. -Continue with lifestyle changes and weight loss to manage your cholesterol levels.  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 -We will send a prescription for betamethasone  cream to the pharmacy.  Return if symptoms worsen or fail to improve.   Take care, Dr Kennyth  PLEASE NOTE:  If you had any lab tests, please let us  know if you have not heard back within a few days. You may see your results on mychart before we have a chance to review them but we will give you a call once they are reviewed by us .   If we ordered any referrals today, please let us  know if you have not heard  from their office within the next week.   If you had any urgent prescriptions sent in today, please check with the pharmacy within an hour of our visit to make sure the prescription was transmitted appropriately.   Please try these tips to maintain a healthy lifestyle:  Eat at least 3 REAL meals and 1-2 snacks per day.  Aim for no more than 5 hours between eating.  If you eat breakfast, please do so within one hour of getting up.   Each meal should contain half fruits/vegetables, one quarter protein, and one quarter carbs (no bigger than a computer mouse)  Cut down on sweet beverages. This includes juice, soda, and sweet tea.   Drink at least 1 glass of water with each meal and aim for at least 8 glasses per day  Exercise at least 150 minutes every week.

## 2023-11-21 NOTE — Assessment & Plan Note (Signed)
 Apparently had mildly elevated LDL on recent labs found at the weight loss clinic.  He will has a copy of his records and send them via MyChart.

## 2023-11-21 NOTE — Assessment & Plan Note (Signed)
 At goal today.  We natural course of blood pressure readings and that the temporary elevations are not harmful.  Given that his blood pressure is well-controlled today.  We will continue with watchful waiting for the next 2 weeks.  He will monitor at home and follow-up with us  in a few weeks.  We discussed proper technique for measuring blood pressure.

## 2023-11-21 NOTE — Telephone Encounter (Signed)
  Pharmacy comment: Alternative Requested:SEE COVERED ALTERNATIVES.  All Pharmacy Suggested Alternatives: ? mometasone (ELOCON) 0.1 % ointment ? fluocinonide ointment (LIDEX) 0.05 % ? Desoximetasone (TOPICORT) 0.25 % ointment ? triamcinolone ointment (KENALOG) 0.5 %

## 2023-11-22 ENCOUNTER — Other Ambulatory Visit (HOSPITAL_COMMUNITY): Payer: Self-pay

## 2023-11-22 NOTE — Telephone Encounter (Signed)
 Pharmacy Patient Advocate Encounter  Received notification from [facility] MEDICAID that Prior Authorization for Zepbound  5MG /0.5ML pen-injectors  has been DENIED.  No reason given; No denial letter received via Fax or CMM. It has been requested and will be uploaded to the media tab once received.  PLEASE BE ADVISED WEGOVY MAY BE CONSIDERED FOR THIS PATIENT.   PA #/Case ID/Reference #: [id number]  Pharmacy Patient Advocate Encounter  Effective October 1st, Medicaid will discontinue coverage of GLP1 medications for weight loss (such as Wegovy and Zepbound ), unless the patient has a documented history of a heart attack or stroke. Zepbound  will continue to be covered only for patients with moderate to severe sleep apnea (AHI 15-30). Because of this change, the prior authorization team will not be submitting new PA requests for GLP1 medications prescribed for weight loss between now and October 1st, as patients will be unable to continue therapy under Medicaid coverage.

## 2023-11-25 NOTE — Telephone Encounter (Signed)
 Spoke with patient father, aware Rx denied by insurance  Advise to call insurance for alternatives  Verbalized understanding

## 2023-11-25 NOTE — Telephone Encounter (Signed)
 Please let patient and family know that medicaid is no longer paying for glp agonists unless he has a diagnosis of diabetes, sleep apnea, or previous heart attack or stroke.

## 2023-11-25 NOTE — Telephone Encounter (Signed)
 FYI on PA for patient

## 2023-11-26 ENCOUNTER — Other Ambulatory Visit (HOSPITAL_COMMUNITY): Payer: Self-pay

## 2023-12-08 DIAGNOSIS — Z419 Encounter for procedure for purposes other than remedying health state, unspecified: Secondary | ICD-10-CM | POA: Diagnosis not present

## 2023-12-30 ENCOUNTER — Encounter: Payer: Self-pay | Admitting: Radiology

## 2024-01-08 DIAGNOSIS — Z419 Encounter for procedure for purposes other than remedying health state, unspecified: Secondary | ICD-10-CM | POA: Diagnosis not present

## 2024-02-25 ENCOUNTER — Ambulatory Visit: Admitting: Family Medicine

## 2024-03-27 ENCOUNTER — Ambulatory Visit (INDEPENDENT_AMBULATORY_CARE_PROVIDER_SITE_OTHER): Admitting: Family Medicine

## 2024-03-27 ENCOUNTER — Encounter: Payer: Self-pay | Admitting: Family Medicine

## 2024-03-27 VITALS — BP 100/60 | HR 80 | Temp 96.8°F | Ht 69.0 in | Wt 267.0 lb

## 2024-03-27 DIAGNOSIS — Z13220 Encounter for screening for lipoid disorders: Secondary | ICD-10-CM | POA: Diagnosis not present

## 2024-03-27 DIAGNOSIS — Z114 Encounter for screening for human immunodeficiency virus [HIV]: Secondary | ICD-10-CM | POA: Diagnosis not present

## 2024-03-27 DIAGNOSIS — E785 Hyperlipidemia, unspecified: Secondary | ICD-10-CM

## 2024-03-27 DIAGNOSIS — Z1159 Encounter for screening for other viral diseases: Secondary | ICD-10-CM

## 2024-03-27 DIAGNOSIS — Z0001 Encounter for general adult medical examination with abnormal findings: Secondary | ICD-10-CM | POA: Diagnosis not present

## 2024-03-27 DIAGNOSIS — F419 Anxiety disorder, unspecified: Secondary | ICD-10-CM

## 2024-03-27 DIAGNOSIS — Z131 Encounter for screening for diabetes mellitus: Secondary | ICD-10-CM

## 2024-03-27 LAB — CBC
HCT: 45.2 % (ref 39.0–52.0)
Hemoglobin: 14.9 g/dL (ref 13.0–17.0)
MCHC: 32.9 g/dL (ref 30.0–36.0)
MCV: 81.3 fl (ref 78.0–100.0)
Platelets: 293 10*3/uL (ref 150.0–400.0)
RBC: 5.56 Mil/uL (ref 4.22–5.81)
RDW: 13.4 % (ref 11.5–14.6)
WBC: 8.3 10*3/uL (ref 4.5–10.5)

## 2024-03-27 LAB — LIPID PANEL
Cholesterol: 173 mg/dL (ref 28–200)
HDL: 38 mg/dL — ABNORMAL LOW
LDL Cholesterol: 118 mg/dL — ABNORMAL HIGH (ref 10–99)
NonHDL: 134.52
Total CHOL/HDL Ratio: 5
Triglycerides: 81 mg/dL (ref 10.0–149.0)
VLDL: 16.2 mg/dL (ref 0.0–40.0)

## 2024-03-27 LAB — HEMOGLOBIN A1C: Hgb A1c MFr Bld: 5.4 % (ref 4.6–6.5)

## 2024-03-27 LAB — TSH: TSH: 2.16 u[IU]/mL (ref 0.35–5.50)

## 2024-03-27 LAB — COMPREHENSIVE METABOLIC PANEL WITH GFR
ALT: 20 U/L (ref 3–53)
AST: 21 U/L (ref 5–37)
Albumin: 4.4 g/dL (ref 3.5–5.2)
Alkaline Phosphatase: 81 U/L (ref 39–117)
BUN: 9 mg/dL (ref 6–23)
CO2: 25 meq/L (ref 19–32)
Calcium: 9.4 mg/dL (ref 8.4–10.5)
Chloride: 105 meq/L (ref 96–112)
Creatinine, Ser: 0.98 mg/dL (ref 0.40–1.50)
GFR: 110.72 mL/min
Glucose, Bld: 90 mg/dL (ref 70–99)
Potassium: 3.6 meq/L (ref 3.5–5.1)
Sodium: 139 meq/L (ref 135–145)
Total Bilirubin: 0.4 mg/dL (ref 0.2–1.2)
Total Protein: 7.5 g/dL (ref 6.0–8.3)

## 2024-03-27 MED ORDER — BUPROPION HCL ER (XL) 150 MG PO TB24: 150.0000 mg | ORAL_TABLET | Freq: Every day | ORAL | 3 refills | Status: AC

## 2024-03-27 MED ORDER — DULOXETINE HCL 30 MG PO CPEP: 30.0000 mg | ORAL_CAPSULE | Freq: Every day | ORAL | 3 refills | Status: AC

## 2024-03-27 NOTE — Assessment & Plan Note (Signed)
 Symptoms well-controlled with current regimen Cymbalta  30 mg daily and Wellbutrin  150 mg daily.  Tolerating well.  Will refill today.

## 2024-03-27 NOTE — Patient Instructions (Signed)
 It was very nice to see you today!  VISIT SUMMARY: Today, you had your annual physical exam. We discussed your weight loss progress, current medications, and overall health maintenance.  YOUR PLAN: MORBID OBESITY: You have lost 30 pounds and are currently on Tirzepatide  2.5 mg weekly due to side effects at higher doses. -Continue taking Tirzepatide  2.5 mg weekly. -Maintain your current diet and exercise regimen. -If insurance issues with Tirzepatide  persist, consider switching to oral Wegovy.  DEPRESSION AND ANXIETY: Your depression and anxiety are managed with Wellbutrin  and Cymbalta , which you find effective. -Refilled your prescriptions for Wellbutrin  and Cymbalta .  GENERAL HEALTH MAINTENANCE: Routine blood work is needed to monitor your health status. -Complete the ordered blood work for blood counts, kidney function, liver function, HIV, hepatitis C, and A1c.  Return in about 1 year (around 03/27/2025) for Annual Physical.   Take care, Dr Kennyth  PLEASE NOTE:  If you had any lab tests, please let us  know if you have not heard back within a few days. You may see your results on mychart before we have a chance to review them but we will give you a call once they are reviewed by us .   If we ordered any referrals today, please let us  know if you have not heard from their office within the next week.   If you had any urgent prescriptions sent in today, please check with the pharmacy within an hour of our visit to make sure the prescription was transmitted appropriately.   Please try these tips to maintain a healthy lifestyle:  Eat at least 3 REAL meals and 1-2 snacks per day.  Aim for no more than 5 hours between eating.  If you eat breakfast, please do so within one hour of getting up.   Each meal should contain half fruits/vegetables, one quarter protein, and one quarter carbs (no bigger than a computer mouse)  Cut down on sweet beverages. This includes juice, soda, and sweet  tea.   Drink at least 1 glass of water with each meal and aim for at least 8 glasses per day  Exercise at least 150 minutes every week.    Preventive Care 48-52 Years Old, Male Preventive care refers to lifestyle choices and visits with your health care provider that can promote health and wellness. At this stage in your life, you may start seeing a primary care physician instead of a pediatrician for your preventive care. Preventive care visits are also called wellness exams. What can I expect for my preventive care visit? Counseling During your preventive care visit, your health care provider may ask about your: Medical history, including: Past medical problems. Family medical history. Current health, including: Home life and relationship well-being. Emotional well-being. Sexual activity and sexual health. Lifestyle, including: Alcohol, nicotine or tobacco, and drug use. Access to firearms. Diet, exercise, and sleep habits. Sunscreen use. Motor vehicle safety. Physical exam Your health care provider may check your: Height and weight. These may be used to calculate your BMI (body mass index). BMI is a measurement that tells if you are at a healthy weight. Waist circumference. This measures the distance around your waistline. This measurement also tells if you are at a healthy weight and may help predict your risk of certain diseases, such as type 2 diabetes and high blood pressure. Heart rate and blood pressure. Body temperature. Skin for abnormal spots. What immunizations do I need?  Vaccines are usually given at various ages, according to a schedule. Your health  care provider will recommend vaccines for you based on your age, medical history, and lifestyle or other factors, such as travel or where you work. What tests do I need? Screening Your health care provider may recommend screening tests for certain conditions. This may include: Vision and hearing tests. Lipid and  cholesterol levels. Hepatitis B test. Hepatitis C test. HIV (human immunodeficiency virus) test. STI (sexually transmitted infection) testing, if you are at risk. Tuberculosis skin test. Talk with your health care provider about your test results, treatment options, and if necessary, the need for more tests. Follow these instructions at home: Eating and drinking  Eat a healthy diet that includes fresh fruits and vegetables, whole grains, lean protein, and low-fat dairy products. Drink enough fluid to keep your urine pale yellow. Do not drink alcohol if: Your health care provider tells you not to drink. You are under the legal drinking age. In the [place] the legal drinking age is 11. If you drink alcohol: Limit how much you have to 0-2 drinks a day. Know how much alcohol is in your drink. In the [place] one drink equals one 12 oz bottle of beer (355 mL), one 5 oz glass of wine (148 mL), or one 1 oz glass of hard liquor (44 mL). Lifestyle Brush your teeth every morning and night with fluoride toothpaste. Floss one time each day. Exercise for at least 30 minutes 5 or more days of the week. Do not use any products that contain nicotine or tobacco. These products include cigarettes, chewing tobacco, and vaping devices, such as e-cigarettes. If you need help quitting, ask your health care provider. Do not use drugs. If you are sexually active, practice safe sex. Use a condom or other form of protection to prevent STIs. Find healthy ways to manage stress, such as: Meditation, yoga, or listening to music. Journaling. Talking to a trusted person. Spending time with friends and family. Safety Always wear your seat belt while driving or riding in a vehicle. Do not drive: If you have been drinking alcohol. Do not ride with someone who has been drinking. When you are tired or distracted. While texting. If you have been using any mind-altering substances or drugs. Wear a helmet and other  protective equipment during sports activities. If you have firearms in your house, make sure you follow all gun safety procedures. Seek help if you have been bullied, physically abused, or sexually abused. Use the internet responsibly to avoid dangers, such as online bullying and online sex predators. What's next? Go to your health care provider once a year for an annual wellness visit. Ask your health care provider how often you should have your eyes and teeth checked. Stay up to date on all vaccines. This information is not intended to replace advice given to you by your health care provider. Make sure you discuss any questions you have with your health care provider. Document Revised: 08/10/2020 Document Reviewed: 08/10/2020 Elsevier Patient Education  2024 [employer].

## 2024-03-27 NOTE — Assessment & Plan Note (Signed)
 BMI is 39.43 today.  He is down about 30 pounds from his max weight a few months ago.  Currently on zepbound  0.5 mg weekly via the weight loss clinic.  We discussed lifestyle modifications.

## 2024-03-27 NOTE — Assessment & Plan Note (Signed)
 Check lipids with labs.  Discussed lifestyle modifications.

## 2024-03-27 NOTE — Progress Notes (Signed)
 "  Chief Complaint:  Garrett Armstrong is a 19 y.o. male who presents today for his annual comprehensive physical exam.    Assessment/Plan:  Chronic Problems Addressed Today: Anxiety Symptoms well-controlled with current regimen Cymbalta  30 mg daily and Wellbutrin  150 mg daily.  Tolerating well.  Will refill today.  Dyslipidemia Check lipids with labs.  Discussed lifestyle modifications.  Morbid obesity (HCC) BMI is 39.43 today.  He is down about 30 pounds from his max weight a few months ago.  Currently on zepbound  0.5 mg weekly via the weight loss clinic.  We discussed lifestyle modifications.  Preventative Healthcare: Check labs.   Patient Counseling(The following topics were reviewed and/or handout was given):  -Nutrition: Stressed importance of moderation in sodium/caffeine intake, saturated fat and cholesterol, caloric balance, sufficient intake of fresh fruits, vegetables, and fiber.  -Stressed the importance of regular exercise.   -Substance Abuse: Discussed cessation/primary prevention of tobacco, alcohol, or other drug use; driving or other dangerous activities under the influence; availability of treatment for abuse.   -Injury prevention: Discussed safety belts, safety helmets, smoke detector, smoking near bedding or upholstery.   -Sexuality: Discussed sexually transmitted diseases, partner selection, use of condoms, avoidance of unintended pregnancy and contraceptive alternatives.   -Dental health: Discussed importance of regular tooth brushing, flossing, and dental visits.  -Health maintenance and immunizations reviewed. Please refer to Health maintenance section.  Return to care in 1 year for next preventative visit.     Subjective:  HPI:  He has no acute complaints today. Patient is here today for his annual physical.  See assessment / plan for status of chronic conditions.  Discussed the use of AI scribe software for clinical note transcription with the patient, who  gave verbal consent to proceed.  History of Present Illness Garrett Armstrong is a 19 year old male who presents for an annual physical exam.  He has lost approximately 30 pounds from a maximum weight of 298 pounds due to participation in a weight management program. He is receiving tirzepatide  at a dose of 2.5 mg weekly. Higher doses previously caused significant nausea and constipation, leading to vomiting, so the dose was reduced. He follows a calorie-restricted diet, aiming for 1300 to 1600 calories per day, though he occasionally consumes higher-calorie foods.  He is currently taking Wellbutrin  and Cymbalta , which he finds effective, and requests refills. 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  He works as a dump naval architect, often on job sites without proper restroom facilities, which he finds challenging due to the [place]. He describes his work environment as sometimes unsanitary, contributing to stress.       03/27/2024    7:47 AM  Depression screen PHQ 2/9  Decreased Interest 0  Down, Depressed, Hopeless 0  PHQ - 2 Score 0    Health Maintenance Due  Topic Date Due   Hepatitis C Screening  Never done     ROS: Per HPI, otherwise a complete review of systems was negative.   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 90 tablet 3   DENTA 5000 PLUS 1.1 % CREA dental cream Take by mouth.     DULoxetine  (CYMBALTA ) 30 MG capsule Take 1 capsule (30 mg total) by mouth daily. 90 capsule 3   mometasone (ELOCON) 0.1 % ointment Apply topically daily. 15 g 2   tirzepatide  (MOUNJARO ) 2.5 MG/0.5ML Pen Inject 2.5 mg into the skin once a week.     tirzepatide  (ZEPBOUND ) 5 MG/0.5ML Pen Inject 5 mg into the skin once a week. 6 mL 5   No current facility-administered medications for this visit.    Allergies-reviewed and updated Allergies[1]  Social History   Socioeconomic History   Marital status: Single    Spouse name: Not on file   Number of children: Not on file   Years of education: Not on file   Highest education level: Not on file  Occupational History   Not on file  Tobacco Use   Smoking status: Never    Passive exposure: Past   Smokeless tobacco: Never   Tobacco comments:    Mother smokes in garage.  And occasionally in the car with patient  Substance and Sexual Activity   Alcohol use: Never   Drug use: Never   Sexual activity: Never  Other Topics Concern   Not on file  Social History Narrative   Lives with adoptive maternal Aunt - [name] and cousin brother Callaway. Adoptive Father largely uninvolved.    Has contact with biologic mother - Alan. Alan has a son named Frederic, he is 18 months.   Social Drivers of Health   Tobacco Use: Low Risk (03/27/2024)   Patient History    Smoking Tobacco  Use: Never    Smokeless Tobacco Use: Never    Passive Exposure: Past  Financial Resource Strain: Not on file  Food Insecurity: Not on file  Transportation Needs: Not on file  Physical Activity: Not on file  Stress: Not on file  Social Connections: Not on file  Depression (PHQ2-9): Low Risk (03/27/2024)   Depression (PHQ2-9)    PHQ-2 Score: 0  Alcohol Screen: Not on file  Housing: Not on file  Utilities: Not on file  Health Literacy: Not on file        Objective:  Physical Exam: BP 100/60   Pulse 80   Temp (!) 96.8 F (36 C) (Temporal)   Ht 5' 9 (1.753 m)   Wt 267 lb (121.1 kg)   SpO2 96%   BMI 39.43 kg/m   Body mass index is 39.43 kg/m. Wt Readings from Last 3 Encounters:  03/27/24 267 lb (121.1 kg)  11/21/23 285 lb (129.3 kg)  01/18/23 271 lb 9.6 oz (123.2 kg) (>99%, Z= 2.72)*   * Growth percentiles are based on CDC (  Boys, 2-20 Years) data.   Gen: NAD, resting comfortably HEENT: TMs normal bilaterally. OP clear. No thyromegaly noted.  CV: RRR with no murmurs appreciated Pulm: NWOB, CTAB with no crackles, wheezes, or rhonchi GI: Normal bowel sounds present. Soft, Nontender, Nondistended. MSK: no edema, cyanosis, or clubbing noted Skin: warm, dry Neuro: CN2-12 grossly intact. Strength 5/5 in upper and lower extremities. Reflexes symmetric and intact bilaterally.  Psych: Normal affect and thought content     Kyon Bentler M. Kennyth, MD 03/27/2024 8:04 AM     [1] No Known Allergies  "

## 2024-03-28 LAB — HIV ANTIBODY (ROUTINE TESTING W REFLEX)
HIV 1&2 Ab, 4th Generation: NONREACTIVE
HIV FINAL INTERPRETATION: NEGATIVE

## 2024-03-28 LAB — HEPATITIS C ANTIBODY: Hepatitis C Ab: NONREACTIVE

## 2024-03-30 ENCOUNTER — Ambulatory Visit: Payer: Self-pay | Admitting: Family Medicine

## 2024-03-30 NOTE — Progress Notes (Signed)
 His LDL is a little elevated but better than last year. All of his other labs are at goal. Do not need to make changes to his treatment plan at this time. He should continue to work on diet and exercise and we can recheck again in a year or so.

## 2025-03-29 ENCOUNTER — Encounter: Admitting: Family Medicine
